# Patient Record
Sex: Male | Born: 2000 | Race: White | Hispanic: Yes | Marital: Single | Smoking: Never smoker
Health system: Southern US, Community
[De-identification: ages and names within clinical notes are randomized; demographics above are authoritative.]

## PROBLEM LIST (undated history)

## (undated) DIAGNOSIS — Z923 Personal history of irradiation: Secondary | ICD-10-CM

## (undated) DIAGNOSIS — E669 Obesity, unspecified: Secondary | ICD-10-CM

## (undated) HISTORY — DX: Obesity, unspecified: E66.9

## (undated) HISTORY — DX: Personal history of irradiation: Z92.3

---
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
## 2022-01-05 ENCOUNTER — Other Ambulatory Visit: Payer: Self-pay

## 2022-01-05 ENCOUNTER — Ambulatory Visit
Admission: RE | Admit: 2022-01-05 | Discharge: 2022-01-05 | Disposition: A | Discharge status: Home / Self Care | Payer: Medicaid Other | Source: Ambulatory Visit | Attending: Radiation Oncology | Admitting: Radiation Oncology

## 2022-01-05 ENCOUNTER — Ambulatory Visit: Payer: Medicaid Other | Admitting: Radiation Oncology

## 2022-01-05 DIAGNOSIS — L910 Hypertrophic scar: Secondary | ICD-10-CM | POA: Insufficient documentation

## 2022-01-06 ENCOUNTER — Ambulatory Visit
Admission: RE | Admit: 2022-01-06 | Discharge: 2022-01-06 | Disposition: A | Discharge status: Home / Self Care | Payer: Medicaid Other | Source: Ambulatory Visit | Attending: Radiation Oncology | Admitting: Radiation Oncology

## 2022-01-06 DIAGNOSIS — L910 Hypertrophic scar: Secondary | ICD-10-CM | POA: Diagnosis not present

## 2022-01-07 ENCOUNTER — Other Ambulatory Visit: Payer: Self-pay

## 2022-01-07 ENCOUNTER — Ambulatory Visit
Admission: RE | Admit: 2022-01-07 | Discharge: 2022-01-07 | Disposition: A | Discharge status: Home / Self Care | Payer: Medicaid Other | Source: Ambulatory Visit | Attending: Radiation Oncology | Admitting: Radiation Oncology

## 2022-01-07 DIAGNOSIS — L910 Hypertrophic scar: Secondary | ICD-10-CM | POA: Diagnosis not present

## 2022-01-08 ENCOUNTER — Encounter: Payer: Self-pay | Admitting: Radiation Oncology

## 2022-01-08 ENCOUNTER — Ambulatory Visit
Admission: RE | Admit: 2022-01-08 | Discharge: 2022-01-08 | Disposition: A | Discharge status: Home / Self Care | Payer: Medicaid Other | Source: Ambulatory Visit | Attending: Radiation Oncology | Admitting: Radiation Oncology

## 2022-01-08 DIAGNOSIS — L910 Hypertrophic scar: Secondary | ICD-10-CM | POA: Diagnosis not present

## 2022-02-06 ENCOUNTER — Encounter: Payer: Self-pay | Admitting: Radiology

## 2022-02-11 NOTE — Progress Notes (Incomplete)
?  Radiation Oncology         (336) [phone] ?________________________________ ? ?Name: Tag Wurtz MRN: [id number]  ?Date: 02/12/2022  DOB: 04-07-2001 ? ?Follow-Up Visit Note ? ?CC: Coccaro, Althea Grimmer, MD  Nita Sells, MD ? ?No diagnosis found. ? 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 A diagnosis of Keloid was also pertinent to this visit. ?  ?Keloids; bilateral earlobes ? ?Interval Since Last Radiation:  1 month and 4 days  ? ?Intent: Curative ? ?Radiation Treatment Dates: 01/06/2022 through 01/08/2022 ?Site Technique Total Dose (Gy) Dose per Fx (Gy) Completed Fx Beam Energies  ?Pinna, Right: HN_R Complex 12/12 4 3/3 6E  ?Pinna, Left: HN_L Complex 12/12 4 3/3 6E  ? ? ?Narrative:  The patient returns today for routine follow-up. The patient tolerated radiation therapy very well. During his final weekly treatment check on 01/06/22, the patient did not report any complaints.   ? ?No significant interval history since the patient was last seen. ? ?***                           ? ?Allergies:  has No Known Allergies. ? ?Meds: ?Current Outpatient Medications  ?Medication Sig Dispense Refill  ? amoxicillin (AMOXIL) 500 MG capsule Take 1 capsule (500 mg total) by mouth 3 (three) times daily. x10 days (Patient not taking: Reported on 02/11/2021) 30 capsule 0  ? metFORMIN (GLUCOPHAGE) 500 MG tablet Take one tablet twice daily. 60 tablet 6  ? omeprazole (PRILOSEC) 20 MG capsule Take one capsule twice daily. 60 capsule 6  ? ?No current facility-administered medications for this encounter.  ? ? ?Physical Findings: ?The patient is in no acute distress. Patient is alert and oriented. ? vitals were not taken for this visit. .  No significant changes. Lungs are clear to auscultation bilaterally. Heart has regular rate and rhythm. No palpable cervical, supraclavicular, or axillary adenopathy. Abdomen soft, non-tender, normal bowel sounds. *** ? ? ?Lab Findings: ?No results found for: WBC, HGB, HCT, MCV,  PLT ? ?Radiographic Findings: ?No results found. ? 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 A diagnosis of Keloid was also pertinent to this visit. ?  ?Keloids; bilateral earlobes ? ?The patient is recovering from the effects of radiation.  *** ? ?Plan:  *** ? ? ?*** minutes of total time was spent for this patient encounter, including preparation, face-to-face counseling with the patient and coordination of care, physical exam, and documentation of the encounter. ?____________________________________ ? ?Billie Lade, PhD, MD ? ?This document serves as a record of services personally performed by Antony Blackbird, MD. It was created on his behalf by Neena Rhymes, a trained medical scribe. The creation of this record is based on the scribe's personal observations and the provider's statements to them. This document has been checked and approved by the attending provider. ? ?

## 2022-02-11 NOTE — Progress Notes (Incomplete)
?  Radiation Oncology         (336) [phone] ?________________________________ ? ?Patient Name: Eric Gregory ?MRN: [id number] ?DOB: 06/02/2001 ?Referring Physician: Duard Larsen (Profile Not Attached) ?Date of Service: 01/08/2022 ?[facility] [place] ? ?                                                      End Of Treatment Note ? 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 ?Keloids; bilateral earlobes ? ?Intent: Curative ? ?Radiation Treatment Dates: 01/06/2022 through 01/08/2022 ?Site Technique Total Dose (Gy) Dose per Fx (Gy) Completed Fx Beam Energies  ?Pinna, Right: HN_R Complex 12/12 4 3/3 6E  ?Pinna, Left: HN_L Complex 12/12 4 3/3 6E  ? ?Narrative: The patient tolerated radiation therapy very well. During his final weekly treatment check on 01/06/22, the patient did not report any complaints.   ? ?Plan: The patient will follow-up with radiation oncology in one month . ? ?________________________________________________ ?----------------------------------- ? ?Billie Lade, PhD, MD ? ?This document serves as a record of services personally performed by Antony Blackbird, MD. It was created on his behalf by Neena Rhymes, a trained medical scribe. The creation of this record is based on the scribe's personal observations and the provider's statements to them. This document has been checked and approved by the attending provider. ? ?

## 2022-02-12 ENCOUNTER — Telehealth: Payer: Self-pay | Admitting: *Deleted

## 2022-02-12 ENCOUNTER — Ambulatory Visit
Admission: RE | Admit: 2022-02-12 | Discharge: 2022-02-12 | Disposition: A | Discharge status: Home / Self Care | Payer: Medicaid Other | Source: Ambulatory Visit | Attending: Radiation Oncology | Admitting: Radiation Oncology

## 2022-02-12 DIAGNOSIS — L910 Hypertrophic scar: Secondary | ICD-10-CM | POA: Insufficient documentation

## 2022-02-12 NOTE — Telephone Encounter (Signed)
CALLED PATIENT TO ASK ABOUT RESCHEDULING FU FOR 02-12-22, SPOKE WITH PATIENT AND RESCHEDULED APPT. FOR 02-26-22 @ 8:30. AM, PATIENT AGREED TO NEW DATE AND TIME ?

## 2022-02-13 ENCOUNTER — Telehealth: Payer: Self-pay | Admitting: *Deleted

## 2022-02-13 NOTE — Telephone Encounter (Signed)
CALLED PATIENT TO ALTER FU ON 02/26/22 DUE TO DR. KINARD BEING ON VACATION, RESCHEDULED FOR 03-12-22 @ 8:15 AM, LVM FOR A RETURN CALL ?

## 2022-02-26 ENCOUNTER — Ambulatory Visit: Payer: Medicaid Other | Admitting: Radiation Oncology

## 2022-03-10 NOTE — Progress Notes (Signed)
?Radiation Oncology         [phone] ?________________________________ ? ?Name: Eric Gregory MRN: [id number]  ?Date: 03/12/2022  DOB: 2001/01/12 ? ?Follow-Up Visit Note ? ?CC: Coccaro, Althea Grimmer, MD  Nita Sells, MD ? ?  ICD-10-CM   ?1. Keloid  L91.0   ?  ? ? 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 A diagnosis of Keloid was also pertinent to this visit. ?  ?Keloids; bilateral earlobes ? ?Interval Since Last Radiation:  2 months and 2 days  ? ?Intent: Curative ? ?Radiation Treatment Dates: 01/06/2022 through 01/08/2022 ?Site Technique Total Dose (Gy) Dose per Fx (Gy) Completed Fx Beam Energies  ?Pinna, Right: HN_R Complex 12/12 4 3/3 6E  ?Pinna, Left: HN_L Complex 12/12 4 3/3 6E  ? ? ?Narrative:  The patient returns today for routine follow-up. The patient tolerated radiation therapy very well. During his final weekly treatment check on 01/06/22, the patient denied any complaints.   ? ?No significant interval history since the patient was last seen. ? ?He denies any itching or discomfort along the lower ear areas bilaterally or drainage.  He is pleased with his cosmetic result.                           ? ?Allergies:  has No Known Allergies. ? ?Meds: ?Current Outpatient Medications  ?Medication Sig Dispense Refill  ? amoxicillin (AMOXIL) 500 MG capsule Take 1 capsule (500 mg total) by mouth 3 (three) times daily. x10 days 30 capsule 0  ? metFORMIN (GLUCOPHAGE) 500 MG tablet Take one tablet twice daily. 60 tablet 6  ? omeprazole (PRILOSEC) 20 MG capsule Take one capsule twice daily. 60 capsule 6  ? ?No current facility-administered medications for this encounter.  ? ? ?Physical Findings: ?The patient is in no acute distress. Patient is alert and oriented. ? height is 5\' 8"  (1.727 m) and weight is 304 lb 6 oz (138.1 kg) (abnormal). His temporal temperature is 97.1 ?F (36.2 ?C) (abnormal). His blood pressure is 133/77 and his pulse is 95. His respiration is 18 and oxygen saturation is 100%. .   No significant changes. Lungs are clear to auscultation bilaterally. Heart has regular rate and rhythm. No palpable cervical, supraclavicular, or axillary adenopathy. Abdomen soft, non-tender, normal bowel sounds.  The left lower ear is healed well.  No palpable or visible signs of keloid recurrence.  No significant radiation changes.  The right lower ear shows approximately 2-3 nodule along the earlobe on the front and back part of his ear. ? ? ?Lab Findings: ?No results found for: WBC, HGB, HCT, MCV, PLT ? ?Radiographic Findings: ?No results found. ? 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 A diagnosis of Keloid was also pertinent to this visit. ?  ?Keloids; bilateral earlobes ? ?Patient has recovered well from his postop radiation therapy.  Excellent cosmetic result along the left side.  He has some mild residual disease along the right lower earlobe.  Hopefully this may resolve with time.  Very minimal on evaluation today. ? ?Plan: As needed follow-up in radiation oncology.  The patient will continue periodic follow-ups with his dermatologist as needed. ? ? ? ?____________________________________ ? ? , PhD, MD ? ?This document serves as a record of services personally performed by Billie Lade, MD. It was created on his behalf by Antony Blackbird, a trained medical scribe. The creation of this record is based on the scribe's personal observations and  the provider's statements to them. This document has been checked and approved by the attending provider. ? ?

## 2022-03-12 ENCOUNTER — Other Ambulatory Visit: Payer: Self-pay

## 2022-03-12 ENCOUNTER — Encounter: Payer: Self-pay | Admitting: Radiation Oncology

## 2022-03-12 ENCOUNTER — Ambulatory Visit
Admission: RE | Admit: 2022-03-12 | Discharge: 2022-03-12 | Disposition: A | Discharge status: Home / Self Care | Payer: Medicaid Other | Source: Ambulatory Visit | Attending: Radiation Oncology | Admitting: Radiation Oncology

## 2022-03-12 DIAGNOSIS — L910 Hypertrophic scar: Secondary | ICD-10-CM | POA: Insufficient documentation

## 2022-03-12 DIAGNOSIS — Z923 Personal history of irradiation: Secondary | ICD-10-CM | POA: Diagnosis not present

## 2022-03-12 DIAGNOSIS — Z7984 Long term (current) use of oral hypoglycemic drugs: Secondary | ICD-10-CM | POA: Insufficient documentation

## 2022-03-12 NOTE — Progress Notes (Signed)
Patient in  for follow up visit after receivng radiation treatment to bilateral pinna. Patient completed treatment on 01/08/22. Patient denies pain to radiation treatment field. Skin remains intact. Appetite is good. Denies nausea or vomiting. ? ? ?Vitals:  ? 03/12/22 0826  ?BP: 133/77  ?Pulse: 95  ?Resp: 18  ?Temp: (!) 97.1 ?F (36.2 ?C)  ?TempSrc: Temporal  ?SpO2: 100%  ?Weight: (!) 304 lb 6 oz (138.1 kg)  ?Height: 5\' 8"  (1.727 m)  ?  ?

## 2022-06-03 ENCOUNTER — Encounter (INDEPENDENT_AMBULATORY_CARE_PROVIDER_SITE_OTHER): Payer: Self-pay

## 2023-04-08 ENCOUNTER — Encounter: Payer: Self-pay | Admitting: Radiation Oncology

## 2023-04-08 NOTE — Progress Notes (Incomplete)
?  Radiation Oncology         [phone] ?________________________________ ? ?Patient Name: Eric Gregory ?MRN: [id number] ?DOB: 04/22/2001 ?Referring Physician: HALL JR JOHN (Profile Not Attached) ?Date of Service: 01/08/2022 ?[facility] [place] ? ?                                                      End Of Treatment Note ? 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 ?Keloids; bilateral earlobes ? ?Intent: Curative ? ?Radiation Treatment Dates: 01/06/2022 through 01/08/2022 ?Site Technique Total Dose (Gy) Dose per Fx (Gy) Completed Fx Beam Energies  ?Pinna, Right: HN_R Complex 12/12 4 3/3 6E  ?Pinna, Left: HN_L Complex 12/12 4 3/3 6E  ? ?Narrative: The patient tolerated radiation therapy very well. During his final weekly treatment check on 01/06/22, the patient did not report any complaints.   ? ?Plan: The patient will follow-up with radiation oncology in one month . ? ?________________________________________________ ?----------------------------------- ? ?James D. Kinard, PhD, MD ? ?This document serves as a record of services personally performed by James Kinard, MD. It was created on his behalf by Elisa Frazier, a trained medical scribe. The creation of this record is based on the scribe's personal observations and the provider's statements to them. This document has been checked and approved by the attending provider. ? ?
